# Patient Record
Sex: Female | Born: 2000
Health system: Southern US, Community
[De-identification: ages and names within clinical notes are randomized; demographics above are authoritative.]

## PROBLEM LIST (undated history)

## (undated) DIAGNOSIS — F909 Attention-deficit hyperactivity disorder, unspecified type: Secondary | ICD-10-CM

## (undated) DIAGNOSIS — F913 Oppositional defiant disorder: Secondary | ICD-10-CM

## (undated) DIAGNOSIS — G43909 Migraine, unspecified, not intractable, without status migrainosus: Secondary | ICD-10-CM

---
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
## 2019-03-26 DIAGNOSIS — Z13220 Encounter for screening for lipoid disorders: Secondary | ICD-10-CM | POA: Diagnosis not present

## 2019-03-26 DIAGNOSIS — G43009 Migraine without aura, not intractable, without status migrainosus: Secondary | ICD-10-CM | POA: Diagnosis not present

## 2019-03-26 DIAGNOSIS — F411 Generalized anxiety disorder: Secondary | ICD-10-CM | POA: Diagnosis not present

## 2019-03-26 DIAGNOSIS — Z62810 Personal history of physical and sexual abuse in childhood: Secondary | ICD-10-CM | POA: Diagnosis not present

## 2019-03-26 DIAGNOSIS — Z0000 Encounter for general adult medical examination without abnormal findings: Secondary | ICD-10-CM | POA: Diagnosis not present

## 2019-04-20 DIAGNOSIS — M545 Low back pain: Secondary | ICD-10-CM | POA: Diagnosis not present

## 2019-04-20 DIAGNOSIS — G8929 Other chronic pain: Secondary | ICD-10-CM | POA: Diagnosis not present

## 2019-04-20 DIAGNOSIS — F411 Generalized anxiety disorder: Secondary | ICD-10-CM | POA: Diagnosis not present

## 2019-04-20 DIAGNOSIS — G43009 Migraine without aura, not intractable, without status migrainosus: Secondary | ICD-10-CM | POA: Diagnosis not present

## 2019-05-25 DIAGNOSIS — M545 Low back pain: Secondary | ICD-10-CM | POA: Diagnosis not present

## 2019-05-25 DIAGNOSIS — G8929 Other chronic pain: Secondary | ICD-10-CM | POA: Diagnosis not present

## 2019-05-25 DIAGNOSIS — F330 Major depressive disorder, recurrent, mild: Secondary | ICD-10-CM | POA: Diagnosis not present

## 2019-05-25 DIAGNOSIS — F411 Generalized anxiety disorder: Secondary | ICD-10-CM | POA: Diagnosis not present

## 2019-06-15 DIAGNOSIS — Q72892 Other reduction defects of left lower limb: Secondary | ICD-10-CM | POA: Diagnosis not present

## 2019-06-15 DIAGNOSIS — M9903 Segmental and somatic dysfunction of lumbar region: Secondary | ICD-10-CM | POA: Diagnosis not present

## 2019-06-15 DIAGNOSIS — M9904 Segmental and somatic dysfunction of sacral region: Secondary | ICD-10-CM | POA: Diagnosis not present

## 2019-06-15 DIAGNOSIS — M9905 Segmental and somatic dysfunction of pelvic region: Secondary | ICD-10-CM | POA: Diagnosis not present

## 2019-06-16 DIAGNOSIS — Q72892 Other reduction defects of left lower limb: Secondary | ICD-10-CM | POA: Diagnosis not present

## 2019-06-16 DIAGNOSIS — M9905 Segmental and somatic dysfunction of pelvic region: Secondary | ICD-10-CM | POA: Diagnosis not present

## 2019-06-16 DIAGNOSIS — M9904 Segmental and somatic dysfunction of sacral region: Secondary | ICD-10-CM | POA: Diagnosis not present

## 2019-06-16 DIAGNOSIS — M9903 Segmental and somatic dysfunction of lumbar region: Secondary | ICD-10-CM | POA: Diagnosis not present

## 2019-06-17 DIAGNOSIS — M9905 Segmental and somatic dysfunction of pelvic region: Secondary | ICD-10-CM | POA: Diagnosis not present

## 2019-06-17 DIAGNOSIS — Q72892 Other reduction defects of left lower limb: Secondary | ICD-10-CM | POA: Diagnosis not present

## 2019-06-17 DIAGNOSIS — M9903 Segmental and somatic dysfunction of lumbar region: Secondary | ICD-10-CM | POA: Diagnosis not present

## 2019-06-17 DIAGNOSIS — M9904 Segmental and somatic dysfunction of sacral region: Secondary | ICD-10-CM | POA: Diagnosis not present

## 2019-06-18 DIAGNOSIS — M9904 Segmental and somatic dysfunction of sacral region: Secondary | ICD-10-CM | POA: Diagnosis not present

## 2019-06-18 DIAGNOSIS — M9903 Segmental and somatic dysfunction of lumbar region: Secondary | ICD-10-CM | POA: Diagnosis not present

## 2019-06-18 DIAGNOSIS — M9905 Segmental and somatic dysfunction of pelvic region: Secondary | ICD-10-CM | POA: Diagnosis not present

## 2019-06-18 DIAGNOSIS — Q72892 Other reduction defects of left lower limb: Secondary | ICD-10-CM | POA: Diagnosis not present

## 2019-06-22 DIAGNOSIS — Q72892 Other reduction defects of left lower limb: Secondary | ICD-10-CM | POA: Diagnosis not present

## 2019-06-22 DIAGNOSIS — M9903 Segmental and somatic dysfunction of lumbar region: Secondary | ICD-10-CM | POA: Diagnosis not present

## 2019-06-22 DIAGNOSIS — M9904 Segmental and somatic dysfunction of sacral region: Secondary | ICD-10-CM | POA: Diagnosis not present

## 2019-06-22 DIAGNOSIS — M9905 Segmental and somatic dysfunction of pelvic region: Secondary | ICD-10-CM | POA: Diagnosis not present

## 2019-06-24 DIAGNOSIS — M9904 Segmental and somatic dysfunction of sacral region: Secondary | ICD-10-CM | POA: Diagnosis not present

## 2019-06-24 DIAGNOSIS — Q72892 Other reduction defects of left lower limb: Secondary | ICD-10-CM | POA: Diagnosis not present

## 2019-06-24 DIAGNOSIS — M9905 Segmental and somatic dysfunction of pelvic region: Secondary | ICD-10-CM | POA: Diagnosis not present

## 2019-06-24 DIAGNOSIS — M9903 Segmental and somatic dysfunction of lumbar region: Secondary | ICD-10-CM | POA: Diagnosis not present

## 2019-06-25 DIAGNOSIS — M9903 Segmental and somatic dysfunction of lumbar region: Secondary | ICD-10-CM | POA: Diagnosis not present

## 2019-06-25 DIAGNOSIS — M9905 Segmental and somatic dysfunction of pelvic region: Secondary | ICD-10-CM | POA: Diagnosis not present

## 2019-06-25 DIAGNOSIS — M9904 Segmental and somatic dysfunction of sacral region: Secondary | ICD-10-CM | POA: Diagnosis not present

## 2019-06-25 DIAGNOSIS — Q72892 Other reduction defects of left lower limb: Secondary | ICD-10-CM | POA: Diagnosis not present

## 2019-06-29 DIAGNOSIS — M9904 Segmental and somatic dysfunction of sacral region: Secondary | ICD-10-CM | POA: Diagnosis not present

## 2019-06-29 DIAGNOSIS — M9905 Segmental and somatic dysfunction of pelvic region: Secondary | ICD-10-CM | POA: Diagnosis not present

## 2019-06-29 DIAGNOSIS — Q72892 Other reduction defects of left lower limb: Secondary | ICD-10-CM | POA: Diagnosis not present

## 2019-06-29 DIAGNOSIS — M9903 Segmental and somatic dysfunction of lumbar region: Secondary | ICD-10-CM | POA: Diagnosis not present

## 2019-07-01 DIAGNOSIS — Q72892 Other reduction defects of left lower limb: Secondary | ICD-10-CM | POA: Diagnosis not present

## 2019-07-01 DIAGNOSIS — M9904 Segmental and somatic dysfunction of sacral region: Secondary | ICD-10-CM | POA: Diagnosis not present

## 2019-07-01 DIAGNOSIS — M9905 Segmental and somatic dysfunction of pelvic region: Secondary | ICD-10-CM | POA: Diagnosis not present

## 2019-07-01 DIAGNOSIS — M9903 Segmental and somatic dysfunction of lumbar region: Secondary | ICD-10-CM | POA: Diagnosis not present

## 2019-07-02 DIAGNOSIS — M9905 Segmental and somatic dysfunction of pelvic region: Secondary | ICD-10-CM | POA: Diagnosis not present

## 2019-07-02 DIAGNOSIS — M9904 Segmental and somatic dysfunction of sacral region: Secondary | ICD-10-CM | POA: Diagnosis not present

## 2019-07-02 DIAGNOSIS — Q72892 Other reduction defects of left lower limb: Secondary | ICD-10-CM | POA: Diagnosis not present

## 2019-07-02 DIAGNOSIS — M9903 Segmental and somatic dysfunction of lumbar region: Secondary | ICD-10-CM | POA: Diagnosis not present

## 2019-07-06 DIAGNOSIS — Q72892 Other reduction defects of left lower limb: Secondary | ICD-10-CM | POA: Diagnosis not present

## 2019-07-06 DIAGNOSIS — F330 Major depressive disorder, recurrent, mild: Secondary | ICD-10-CM | POA: Diagnosis not present

## 2019-07-06 DIAGNOSIS — F411 Generalized anxiety disorder: Secondary | ICD-10-CM | POA: Diagnosis not present

## 2019-07-06 DIAGNOSIS — M9903 Segmental and somatic dysfunction of lumbar region: Secondary | ICD-10-CM | POA: Diagnosis not present

## 2019-07-06 DIAGNOSIS — M9905 Segmental and somatic dysfunction of pelvic region: Secondary | ICD-10-CM | POA: Diagnosis not present

## 2019-07-06 DIAGNOSIS — M9904 Segmental and somatic dysfunction of sacral region: Secondary | ICD-10-CM | POA: Diagnosis not present

## 2019-07-08 DIAGNOSIS — M9903 Segmental and somatic dysfunction of lumbar region: Secondary | ICD-10-CM | POA: Diagnosis not present

## 2019-07-08 DIAGNOSIS — M9905 Segmental and somatic dysfunction of pelvic region: Secondary | ICD-10-CM | POA: Diagnosis not present

## 2019-07-08 DIAGNOSIS — M9904 Segmental and somatic dysfunction of sacral region: Secondary | ICD-10-CM | POA: Diagnosis not present

## 2019-07-08 DIAGNOSIS — Q72892 Other reduction defects of left lower limb: Secondary | ICD-10-CM | POA: Diagnosis not present

## 2019-07-09 DIAGNOSIS — M9905 Segmental and somatic dysfunction of pelvic region: Secondary | ICD-10-CM | POA: Diagnosis not present

## 2019-07-09 DIAGNOSIS — M9904 Segmental and somatic dysfunction of sacral region: Secondary | ICD-10-CM | POA: Diagnosis not present

## 2019-07-09 DIAGNOSIS — M9903 Segmental and somatic dysfunction of lumbar region: Secondary | ICD-10-CM | POA: Diagnosis not present

## 2019-07-09 DIAGNOSIS — Q72892 Other reduction defects of left lower limb: Secondary | ICD-10-CM | POA: Diagnosis not present

## 2019-07-13 DIAGNOSIS — M9904 Segmental and somatic dysfunction of sacral region: Secondary | ICD-10-CM | POA: Diagnosis not present

## 2019-07-13 DIAGNOSIS — Q72892 Other reduction defects of left lower limb: Secondary | ICD-10-CM | POA: Diagnosis not present

## 2019-07-13 DIAGNOSIS — M9903 Segmental and somatic dysfunction of lumbar region: Secondary | ICD-10-CM | POA: Diagnosis not present

## 2019-07-13 DIAGNOSIS — M9905 Segmental and somatic dysfunction of pelvic region: Secondary | ICD-10-CM | POA: Diagnosis not present

## 2019-07-15 DIAGNOSIS — M9903 Segmental and somatic dysfunction of lumbar region: Secondary | ICD-10-CM | POA: Diagnosis not present

## 2019-07-15 DIAGNOSIS — Q72892 Other reduction defects of left lower limb: Secondary | ICD-10-CM | POA: Diagnosis not present

## 2019-07-15 DIAGNOSIS — M9905 Segmental and somatic dysfunction of pelvic region: Secondary | ICD-10-CM | POA: Diagnosis not present

## 2019-07-15 DIAGNOSIS — M9904 Segmental and somatic dysfunction of sacral region: Secondary | ICD-10-CM | POA: Diagnosis not present

## 2019-07-21 DIAGNOSIS — M9904 Segmental and somatic dysfunction of sacral region: Secondary | ICD-10-CM | POA: Diagnosis not present

## 2019-07-21 DIAGNOSIS — M9905 Segmental and somatic dysfunction of pelvic region: Secondary | ICD-10-CM | POA: Diagnosis not present

## 2019-07-21 DIAGNOSIS — M9903 Segmental and somatic dysfunction of lumbar region: Secondary | ICD-10-CM | POA: Diagnosis not present

## 2019-07-21 DIAGNOSIS — Q72892 Other reduction defects of left lower limb: Secondary | ICD-10-CM | POA: Diagnosis not present

## 2019-08-04 DIAGNOSIS — M9905 Segmental and somatic dysfunction of pelvic region: Secondary | ICD-10-CM | POA: Diagnosis not present

## 2019-08-04 DIAGNOSIS — M9904 Segmental and somatic dysfunction of sacral region: Secondary | ICD-10-CM | POA: Diagnosis not present

## 2019-08-04 DIAGNOSIS — M9903 Segmental and somatic dysfunction of lumbar region: Secondary | ICD-10-CM | POA: Diagnosis not present

## 2019-08-04 DIAGNOSIS — Q72892 Other reduction defects of left lower limb: Secondary | ICD-10-CM | POA: Diagnosis not present

## 2019-08-11 DIAGNOSIS — M9904 Segmental and somatic dysfunction of sacral region: Secondary | ICD-10-CM | POA: Diagnosis not present

## 2019-08-11 DIAGNOSIS — M9905 Segmental and somatic dysfunction of pelvic region: Secondary | ICD-10-CM | POA: Diagnosis not present

## 2019-08-11 DIAGNOSIS — M9903 Segmental and somatic dysfunction of lumbar region: Secondary | ICD-10-CM | POA: Diagnosis not present

## 2019-08-11 DIAGNOSIS — Q72892 Other reduction defects of left lower limb: Secondary | ICD-10-CM | POA: Diagnosis not present

## 2019-08-13 DIAGNOSIS — M9904 Segmental and somatic dysfunction of sacral region: Secondary | ICD-10-CM | POA: Diagnosis not present

## 2019-08-13 DIAGNOSIS — Q72892 Other reduction defects of left lower limb: Secondary | ICD-10-CM | POA: Diagnosis not present

## 2019-08-13 DIAGNOSIS — M9903 Segmental and somatic dysfunction of lumbar region: Secondary | ICD-10-CM | POA: Diagnosis not present

## 2019-08-13 DIAGNOSIS — M9905 Segmental and somatic dysfunction of pelvic region: Secondary | ICD-10-CM | POA: Diagnosis not present

## 2019-08-31 DIAGNOSIS — G8929 Other chronic pain: Secondary | ICD-10-CM | POA: Diagnosis not present

## 2019-08-31 DIAGNOSIS — F411 Generalized anxiety disorder: Secondary | ICD-10-CM | POA: Diagnosis not present

## 2019-08-31 DIAGNOSIS — F330 Major depressive disorder, recurrent, mild: Secondary | ICD-10-CM | POA: Diagnosis not present

## 2019-08-31 DIAGNOSIS — M545 Low back pain: Secondary | ICD-10-CM | POA: Diagnosis not present

## 2019-09-01 DIAGNOSIS — M9904 Segmental and somatic dysfunction of sacral region: Secondary | ICD-10-CM | POA: Diagnosis not present

## 2019-09-01 DIAGNOSIS — Q72892 Other reduction defects of left lower limb: Secondary | ICD-10-CM | POA: Diagnosis not present

## 2019-09-01 DIAGNOSIS — M9905 Segmental and somatic dysfunction of pelvic region: Secondary | ICD-10-CM | POA: Diagnosis not present

## 2019-09-01 DIAGNOSIS — M9903 Segmental and somatic dysfunction of lumbar region: Secondary | ICD-10-CM | POA: Diagnosis not present

## 2019-09-03 DIAGNOSIS — M9903 Segmental and somatic dysfunction of lumbar region: Secondary | ICD-10-CM | POA: Diagnosis not present

## 2019-09-03 DIAGNOSIS — M9905 Segmental and somatic dysfunction of pelvic region: Secondary | ICD-10-CM | POA: Diagnosis not present

## 2019-09-03 DIAGNOSIS — Q72892 Other reduction defects of left lower limb: Secondary | ICD-10-CM | POA: Diagnosis not present

## 2019-09-03 DIAGNOSIS — M9904 Segmental and somatic dysfunction of sacral region: Secondary | ICD-10-CM | POA: Diagnosis not present

## 2020-05-12 DIAGNOSIS — F411 Generalized anxiety disorder: Secondary | ICD-10-CM | POA: Diagnosis not present

## 2020-05-20 DIAGNOSIS — F411 Generalized anxiety disorder: Secondary | ICD-10-CM | POA: Diagnosis not present

## 2020-05-30 DIAGNOSIS — F411 Generalized anxiety disorder: Secondary | ICD-10-CM | POA: Diagnosis not present

## 2020-06-09 DIAGNOSIS — F411 Generalized anxiety disorder: Secondary | ICD-10-CM | POA: Diagnosis not present

## 2020-06-17 DIAGNOSIS — F411 Generalized anxiety disorder: Secondary | ICD-10-CM | POA: Diagnosis not present

## 2020-06-27 DIAGNOSIS — F411 Generalized anxiety disorder: Secondary | ICD-10-CM | POA: Diagnosis not present

## 2020-08-18 DIAGNOSIS — Z3200 Encounter for pregnancy test, result unknown: Secondary | ICD-10-CM | POA: Diagnosis not present

## 2020-08-18 DIAGNOSIS — E559 Vitamin D deficiency, unspecified: Secondary | ICD-10-CM | POA: Diagnosis not present

## 2020-08-18 DIAGNOSIS — M129 Arthropathy, unspecified: Secondary | ICD-10-CM | POA: Diagnosis not present

## 2020-08-18 DIAGNOSIS — Z0000 Encounter for general adult medical examination without abnormal findings: Secondary | ICD-10-CM | POA: Diagnosis not present

## 2020-08-18 DIAGNOSIS — D539 Nutritional anemia, unspecified: Secondary | ICD-10-CM | POA: Diagnosis not present

## 2020-08-18 DIAGNOSIS — Z131 Encounter for screening for diabetes mellitus: Secondary | ICD-10-CM | POA: Diagnosis not present

## 2020-08-18 DIAGNOSIS — Z1159 Encounter for screening for other viral diseases: Secondary | ICD-10-CM | POA: Diagnosis not present

## 2020-08-18 DIAGNOSIS — R634 Abnormal weight loss: Secondary | ICD-10-CM | POA: Diagnosis not present

## 2020-08-18 DIAGNOSIS — Z13220 Encounter for screening for lipoid disorders: Secondary | ICD-10-CM | POA: Diagnosis not present

## 2020-08-19 DIAGNOSIS — F322 Major depressive disorder, single episode, severe without psychotic features: Secondary | ICD-10-CM | POA: Diagnosis not present

## 2020-08-19 DIAGNOSIS — F410 Panic disorder [episodic paroxysmal anxiety] without agoraphobia: Secondary | ICD-10-CM | POA: Diagnosis not present

## 2020-08-19 DIAGNOSIS — F411 Generalized anxiety disorder: Secondary | ICD-10-CM | POA: Diagnosis not present

## 2020-08-19 DIAGNOSIS — Z79899 Other long term (current) drug therapy: Secondary | ICD-10-CM | POA: Diagnosis not present

## 2020-08-19 DIAGNOSIS — F330 Major depressive disorder, recurrent, mild: Secondary | ICD-10-CM | POA: Diagnosis not present

## 2020-08-19 DIAGNOSIS — G4700 Insomnia, unspecified: Secondary | ICD-10-CM | POA: Diagnosis not present

## 2020-08-25 DIAGNOSIS — Z712 Person consulting for explanation of examination or test findings: Secondary | ICD-10-CM | POA: Diagnosis not present

## 2020-08-25 DIAGNOSIS — Z6825 Body mass index (BMI) 25.0-25.9, adult: Secondary | ICD-10-CM | POA: Diagnosis not present

## 2020-08-25 DIAGNOSIS — E559 Vitamin D deficiency, unspecified: Secondary | ICD-10-CM | POA: Diagnosis not present

## 2020-08-25 DIAGNOSIS — Z3200 Encounter for pregnancy test, result unknown: Secondary | ICD-10-CM | POA: Diagnosis not present

## 2020-08-25 DIAGNOSIS — F17210 Nicotine dependence, cigarettes, uncomplicated: Secondary | ICD-10-CM | POA: Diagnosis not present

## 2020-09-15 DIAGNOSIS — G4700 Insomnia, unspecified: Secondary | ICD-10-CM | POA: Diagnosis not present

## 2020-09-15 DIAGNOSIS — F411 Generalized anxiety disorder: Secondary | ICD-10-CM | POA: Diagnosis not present

## 2020-09-15 DIAGNOSIS — F322 Major depressive disorder, single episode, severe without psychotic features: Secondary | ICD-10-CM | POA: Diagnosis not present

## 2020-09-15 DIAGNOSIS — F410 Panic disorder [episodic paroxysmal anxiety] without agoraphobia: Secondary | ICD-10-CM | POA: Diagnosis not present

## 2020-10-13 DIAGNOSIS — F322 Major depressive disorder, single episode, severe without psychotic features: Secondary | ICD-10-CM | POA: Diagnosis not present

## 2020-10-13 DIAGNOSIS — F410 Panic disorder [episodic paroxysmal anxiety] without agoraphobia: Secondary | ICD-10-CM | POA: Diagnosis not present

## 2020-10-13 DIAGNOSIS — F411 Generalized anxiety disorder: Secondary | ICD-10-CM | POA: Diagnosis not present

## 2020-10-13 DIAGNOSIS — G4700 Insomnia, unspecified: Secondary | ICD-10-CM | POA: Diagnosis not present

## 2020-11-26 DIAGNOSIS — M25512 Pain in left shoulder: Secondary | ICD-10-CM | POA: Diagnosis not present

## 2020-11-26 DIAGNOSIS — Z3200 Encounter for pregnancy test, result unknown: Secondary | ICD-10-CM | POA: Diagnosis not present

## 2020-11-26 DIAGNOSIS — E559 Vitamin D deficiency, unspecified: Secondary | ICD-10-CM | POA: Diagnosis not present

## 2020-11-26 DIAGNOSIS — Z760 Encounter for issue of repeat prescription: Secondary | ICD-10-CM | POA: Diagnosis not present
